# Patient Record
Sex: Male | Born: 2000 | Race: Black or African American | Hispanic: No | Smoking: Never smoker
Health system: Southern US, Community
[De-identification: ages and names within clinical notes are randomized; demographics above are authoritative.]

## PROBLEM LIST (undated history)

## (undated) DIAGNOSIS — R0683 Snoring: Secondary | ICD-10-CM

## (undated) DIAGNOSIS — T7840XA Allergy, unspecified, initial encounter: Secondary | ICD-10-CM

## (undated) HISTORY — PX: ADENOIDECTOMY: SUR15

## (undated) HISTORY — DX: Snoring: R06.83

## (undated) HISTORY — PX: CIRCUMCISION: SUR203

## (undated) HISTORY — PX: TONSILLECTOMY: SUR1361

## (undated) HISTORY — DX: Allergy, unspecified, initial encounter: T78.40XA

---
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
## 2022-01-04 ENCOUNTER — Other Ambulatory Visit: Payer: Self-pay | Admitting: Pediatrics

## 2022-01-04 DIAGNOSIS — R7309 Other abnormal glucose: Secondary | ICD-10-CM

## 2022-03-28 ENCOUNTER — Ambulatory Visit: Payer: Medicaid Other | Admitting: Registered"

## 2022-03-29 ENCOUNTER — Ambulatory Visit: Payer: Medicaid Other | Admitting: Pediatrics

## 2022-05-16 ENCOUNTER — Ambulatory Visit (INDEPENDENT_AMBULATORY_CARE_PROVIDER_SITE_OTHER): Payer: Medicaid Other | Admitting: Pediatrics

## 2022-05-16 ENCOUNTER — Encounter: Payer: Self-pay | Admitting: Pediatrics

## 2022-05-16 VITALS — BP 118/76 | Ht 67.5 in | Wt 157.1 lb

## 2022-05-16 DIAGNOSIS — R7309 Other abnormal glucose: Secondary | ICD-10-CM | POA: Diagnosis not present

## 2022-05-16 DIAGNOSIS — R5383 Other fatigue: Secondary | ICD-10-CM | POA: Diagnosis not present

## 2022-05-16 DIAGNOSIS — R7989 Other specified abnormal findings of blood chemistry: Secondary | ICD-10-CM | POA: Diagnosis not present

## 2022-05-16 DIAGNOSIS — Z113 Encounter for screening for infections with a predominantly sexual mode of transmission: Secondary | ICD-10-CM | POA: Diagnosis not present

## 2022-05-16 DIAGNOSIS — Z0001 Encounter for general adult medical examination with abnormal findings: Secondary | ICD-10-CM

## 2022-05-16 DIAGNOSIS — Z00129 Encounter for routine child health examination without abnormal findings: Secondary | ICD-10-CM

## 2022-05-17 LAB — C. TRACHOMATIS/N. GONORRHOEAE RNA
C. trachomatis RNA, TMA: NOT DETECTED
N. gonorrhoeae RNA, TMA: NOT DETECTED

## 2022-06-01 ENCOUNTER — Encounter: Payer: Self-pay | Admitting: Pediatrics

## 2022-06-01 NOTE — Progress Notes (Signed)
Well Child check     Patient ID: Jose Carson, male   DOB: 2001/09/01, 21 y.o.   MRN: [id number]  Chief Complaint  Patient presents with   Well Child  :  HPI: Patient is here for 69 year old well-child check.  Patient here for transition of care.         Patient has already graduated.  He was offered a scholarship at a college, however he chose not to take this, as he did not want to go to that specific college.  Now he regrets it.  He wishes he had gone into that college so at least he could get some experience.  He states all of his friends are in college now, and he is working.  When he goes to party with his friends, parents tell him that given that he is not in college, he should not be attending those parties.  They are encouraging him to get back into academics.  Patient is very frustrated.  He states he never realized that the world was "all about money".  He states when he works at [employer] job, he works 12-hour shifts.  He normally makes about $18 per hour.  However he states that does not give him enough time to try to figure out what kind of other businesses he would like to do.  However he also does not want to leave that environment because he makes the money and go into an environment with shoulder hours, lower pay grade and still allow him to make plans for his future.         Academically did not do as well academically.  Has history of ADHD.  Parents did not want him on ADHD medications.  Mother states now she wishes that they did start him on it, as he depends on them on everything.  She states that the patient will call her when he comes to filling out just basic paperwork.  If he has a flat tire, rather than calling the toe service, the patient calls the father who goes out to help him.       In regards to nutrition patient states that he is eating poorly.  He states he does not get the chance to work out as he is working all the time.   Past Medical History:  Diagnosis  Date   Allergy    Asthma    Snoring      Past Surgical History:  Procedure Laterality Date   ADENOIDECTOMY     CIRCUMCISION     TONSILLECTOMY     TYMPANOSTOMY TUBE PLACEMENT  05/2008     Family History  Problem Relation Age of Onset   Allergies Father    Kidney disease Father        kidney stones   Asthma Brother    Allergies Brother    Hypertension Maternal Uncle    Diabetes Maternal Grandmother    Hypertension Maternal Grandmother    Stroke Maternal Grandmother    Diabetes Paternal Grandfather    Hypertension Paternal Grandfather      Social History   Social History Narrative   Lives at home with mother, father and siblings.   Has graduated,   Works at [employer].    Social History   Occupational History   Not on file  Tobacco Use   Smoking status: Never    Passive exposure: Yes   Smokeless tobacco: Never  Vaping Use   Vaping Use: Former  Substance and  Sexual Activity   Alcohol use: Never   Drug use: Never   Sexual activity: Not Currently     Orders Placed This Encounter  Procedures   C. trachomatis/N. gonorrhoeae RNA   CBC with Differential/Platelet   Comprehensive metabolic panel   T3, free   T4, free   TSH   Lipid panel   Hemoglobin A1c    Outpatient Encounter Medications as of 05/16/2022  Medication Sig Note   albuterol (VENTOLIN HFA) 108 (90 Base) MCG/ACT inhaler 2 puffs every 4-6 hours as needed coughing or wheezing.    beclomethasone (QVAR) 40 MCG/ACT inhaler Inhale 2 puffs using spacer twice a day for asthma prevention    budesonide (PULMICORT) 0.5 MG/2ML nebulizer solution Take 2 mLs (0.5 mg total) by nebulization 2 (two) times daily. 01/09/2012: Only when mom hears wheezing.    fluticasone (FLONASE) 50 MCG/ACT nasal spray USE ONE SPRAY TO EACH NOSTRIL ONCE A DAY AS NEEDED FOR CONGESTION.    fluticasone (FLOVENT HFA) 44 MCG/ACT inhaler 2 puffs twice a day for 7 days.    loratadine (CLARITIN) 10 MG tablet TAKE 1 TABLET BY MOUTH EVERY DAY     Olopatadine HCl (PATADAY) 0.2 % SOLN 1 drop to the affected eye once a day as needed itching.    [DISCONTINUED] predniSONE (DELTASONE) 20 MG tablet 2 tabs p.o. daily x 4 days.    No facility-administered encounter medications on file as of 05/16/2022.     Peach flavor and Shellfish allergy      ROS:  Apart from the symptoms reviewed above, there are no other symptoms referable to all systems reviewed.   Physical Examination   Wt Readings from Last 3 Encounters:  05/16/22 157 lb 2 oz (71.3 kg) (59 %, Z= 0.22)*  03/28/21 155 lb 12.8 oz (70.7 kg) (65 %, Z= 0.38)*  03/29/20 (!) 200 lb 6 oz (90.9 kg) (97 %, Z= 1.83)*   * Growth percentiles are based on CDC (Boys, 2-20 Years) data.   Ht Readings from Last 3 Encounters:  05/16/22 5' 7.5" (1.715 m) (24 %, Z= -0.71)*  03/28/21 5' 7.5" (1.715 m) (27 %, Z= -0.62)*  03/29/20 5\' 9"  (1.753 m) (53 %, Z= 0.07)*   * Growth percentiles are based on CDC (Boys, 2-20 Years) data.   BP Readings from Last 3 Encounters:  05/16/22 118/76  03/28/21 116/72 (47 %, Z = -0.08 /  68 %, Z = 0.47)*  03/29/20 114/72 (44 %, Z = -0.15 /  68 %, Z = 0.47)*   *BP percentiles are based on the 2017 AAP Clinical Practice Guideline for boys   Body mass index is 24.25 kg/m. 72 %ile (Z= 0.58) based on CDC (Boys, 2-20 Years) BMI-for-age based on BMI available as of 05/16/2022. Blood pressure %iles are not available for patients who are 18 years or older. Pulse Readings from Last 3 Encounters:  08/24/19 60  08/15/12 79      General: Alert, cooperative, and appears to be the stated age Head: Normocephalic Eyes: Sclera white, pupils equal and reactive to light, red reflex x 2,  Ears: Normal bilaterally Oral cavity: Lips, mucosa, and tongue normal: Teeth and gums normal Neck: No adenopathy, supple, symmetrical, trachea midline, and thyroid does not appear enlarged Respiratory: Clear to auscultation bilaterally CV: RRR without Murmurs, pulses 2+/= GI: Soft,  nontender, positive bowel sounds, no HSM noted GU: Declined examination SKIN: Clear, No rashes noted NEUROLOGICAL: Grossly intact without focal findings, cranial nerves II through XII intact, muscle strength  equal bilaterally MUSCULOSKELETAL: FROM, no scoliosis noted Psychiatric: Affect appropriate, non-anxious, very philosophical about the future and what is expected which is unusual for [place].   No results found. No results found for this or any previous visit (from the past 240 hour(s)). No results found for this or any previous visit (from the past 48 hour(s)).     03/29/2020    3:14 PM 03/28/2021    1:05 PM 06/01/2022   10:33 AM  PHQ-Adolescent  Down, depressed, hopeless 0 0 1  Decreased interest 0 0 2  Altered sleeping 0 1 0  Change in appetite 0 0 0  Tired, decreased energy 0 0 0  Feeling bad or failure about yourself 0 0 1  Trouble concentrating 0 0 0  Moving slowly or fidgety/restless 0 0 0  Suicidal thoughts 0 0 0  PHQ-Adolescent Score 0 1 4  In the past year have you felt depressed or sad most days, even if you felt okay sometimes? No No Yes  If you are experiencing any of the problems on this form, how difficult have these problems made it for you to do your work, take care of things at home or get along with other people? Not difficult at all Not difficult at all Somewhat difficult  Has there been a time in the past month when you have had serious thoughts about ending your own life? No No No  Have you ever, in your whole life, tried to kill yourself or made a suicide attempt? No No No    Hearing Screening   500Hz  1000Hz  2000Hz  3000Hz  4000Hz   Right ear 20 20 20 20 20   Left ear 20 20 20 20 20    Vision Screening   Right eye Left eye Both eyes  Without correction 20/20 20/20 20/20   With correction          Assessment:  1. Screening for venereal disease   2. Fatigue, unspecified type   3. Abnormal TSH   4. Elevated hemoglobin A1c   5. Encounter for  routine child health examination without abnormal findings 6.  Immunizations      Plan:   [place] in a years time. The patient has been counseled on immunizations.  Up-to-date Patient has had elevated hemoglobin A1c as well as abnormal thyroid function test in the past.  Today we will go ahead and repeat these tests. Discussed at length with patient, perhaps he require someone whom he could speak with.  He does talk with his parents, and takes into account what they are recommendation is.  However he agrees, perhaps he needs someone whom he can speak with in regards to stressors he is facing now into coming into adulthood.  We will ask Georgianne Fick to get in touch with this patient or perhaps recommend someone whom the patient can see. This visit included well-child check as well as separate office visit in regards to discussion with patient about the future plans, academics, business, etc.Patient is given strict return precautions.   Spent 30 minutes with the patient face-to-face of which over 50% was in counseling of above.  No orders of the defined types were placed in this encounter.     Saddie Benders

## 2022-06-04 ENCOUNTER — Telehealth: Payer: Self-pay | Admitting: Licensed Clinical Social Worker

## 2022-06-04 NOTE — Telephone Encounter (Signed)
The Clinician left voicemail for Pt asking for call back should he like to get counseling set up.

## 2023-09-08 ENCOUNTER — Other Ambulatory Visit: Payer: Self-pay

## 2023-09-08 ENCOUNTER — Emergency Department
Admission: EM | Admit: 2023-09-08 | Discharge: 2023-09-08 | Discharge status: Against Medical Advice | Payer: Medicaid Other | Attending: Emergency Medicine | Admitting: Emergency Medicine

## 2023-09-08 DIAGNOSIS — Z5321 Procedure and treatment not carried out due to patient leaving prior to being seen by health care provider: Secondary | ICD-10-CM | POA: Insufficient documentation

## 2023-09-08 DIAGNOSIS — R519 Headache, unspecified: Secondary | ICD-10-CM | POA: Insufficient documentation

## 2023-09-08 DIAGNOSIS — Y92410 Unspecified street and highway as the place of occurrence of the external cause: Secondary | ICD-10-CM | POA: Insufficient documentation
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
## 2023-11-21 ENCOUNTER — Ambulatory Visit: Payer: Medicaid Other | Admitting: Family Medicine
